# Patient Record
Sex: Female | Born: 1955 | Race: Black or African American | Hispanic: No | Marital: Married | Smoking: Former smoker
Health system: Southern US, Community
[De-identification: ages and names within clinical notes are randomized; demographics above are authoritative.]

---

## 1998-04-11 ENCOUNTER — Encounter: Admission: RE | Admit: 1998-04-11 | Discharge: 1998-04-11 | Payer: Self-pay | Admitting: Family Medicine

## 1998-04-28 ENCOUNTER — Ambulatory Visit (HOSPITAL_COMMUNITY): Admission: RE | Admit: 1998-04-28 | Discharge: 1998-04-28 | Payer: Self-pay

## 1998-12-02 ENCOUNTER — Encounter: Admission: RE | Admit: 1998-12-02 | Discharge: 1998-12-02 | Payer: Self-pay | Admitting: Family Medicine

## 2005-03-14 ENCOUNTER — Emergency Department (HOSPITAL_COMMUNITY): Admission: EM | Admit: 2005-03-14 | Discharge: 2005-03-14 | Payer: Self-pay | Admitting: Emergency Medicine

## 2009-10-13 ENCOUNTER — Encounter: Admission: RE | Admit: 2009-10-13 | Discharge: 2009-10-13 | Payer: Self-pay | Admitting: Family Medicine

## 2011-03-06 IMAGING — US US SOFT TISSUE HEAD/NECK
1 series · 14 of 25 positions shown · non-contrast
Comparison: None

CLINICAL DATA: Enlarged thyroid gland.

THYROID ULTRASOUND
TECHNIQUE: Ultrasound examination of the thyroid gland and
adjacent soft tissues was performed.

[Series 1: us soft tissue head/neck · 0.06mm/px · 14 of 31 slices shown]
[im 1/31]
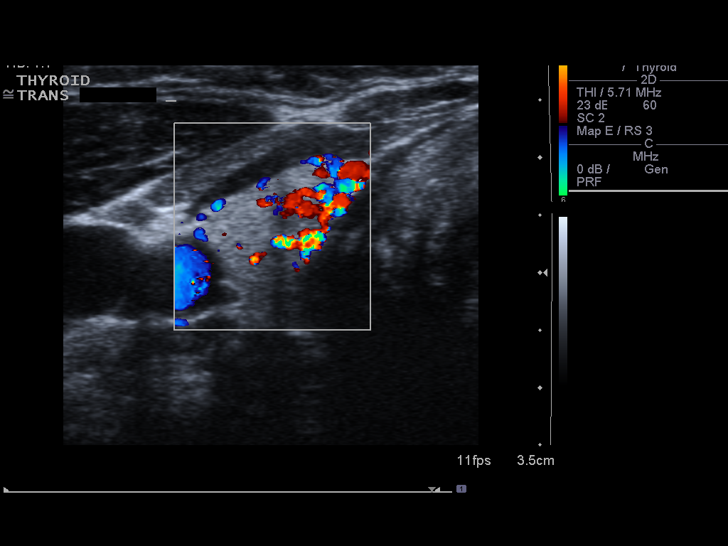
[im 3/31]
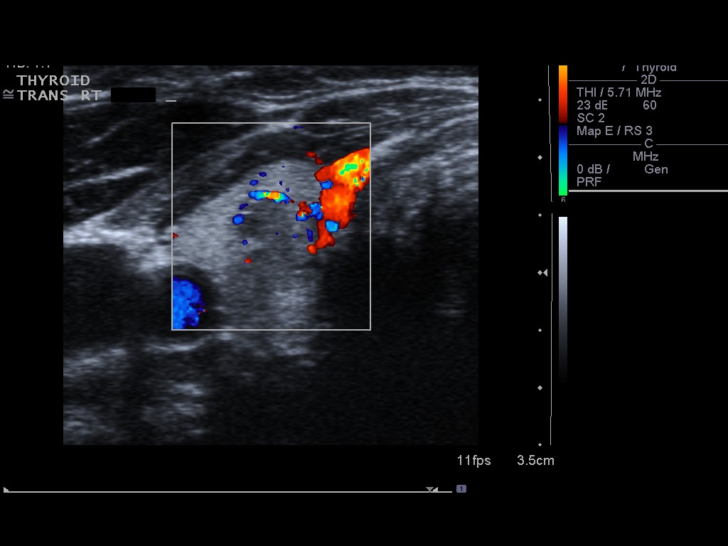
[im 6/31]
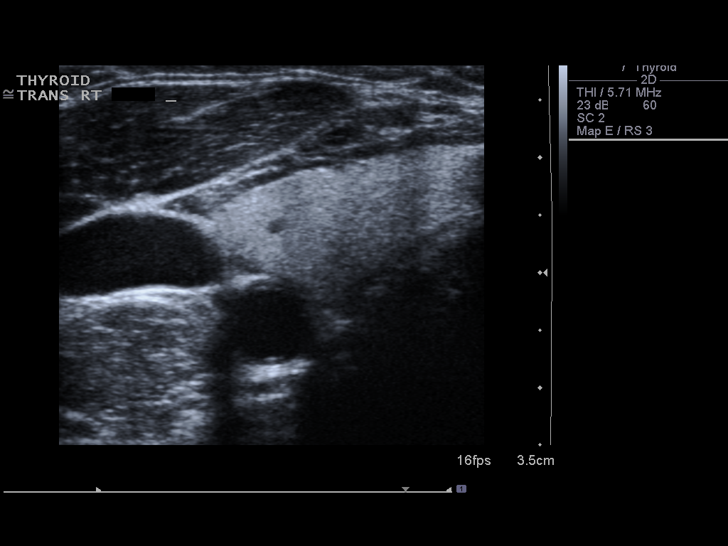
[im 8/31]
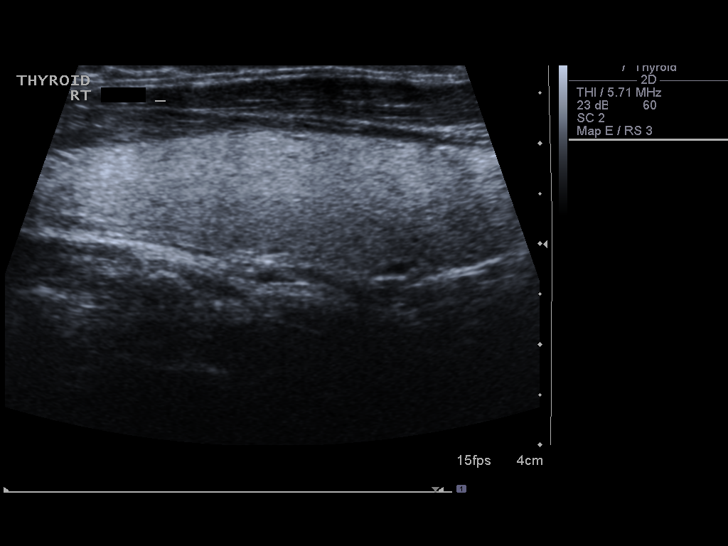
[im 11/31]
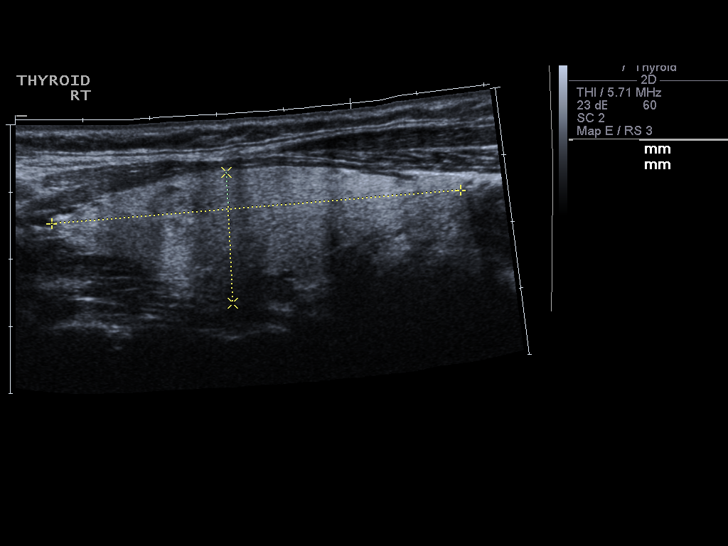
[im 12/31]
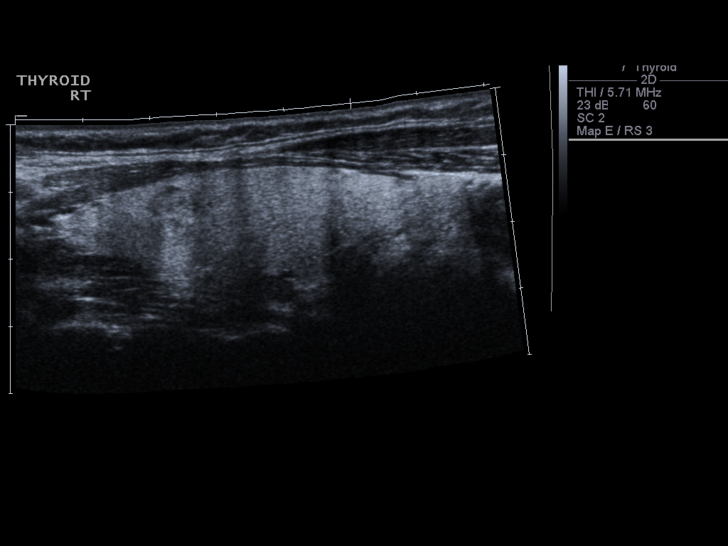
[im 14/31]
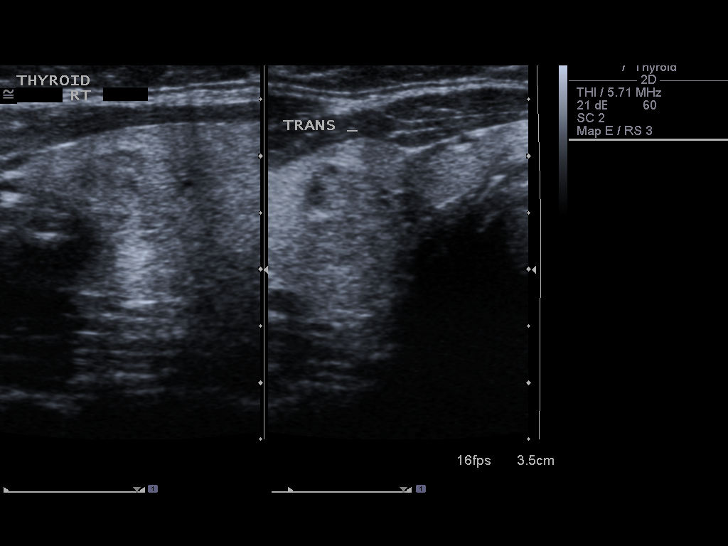
[im 17/31]
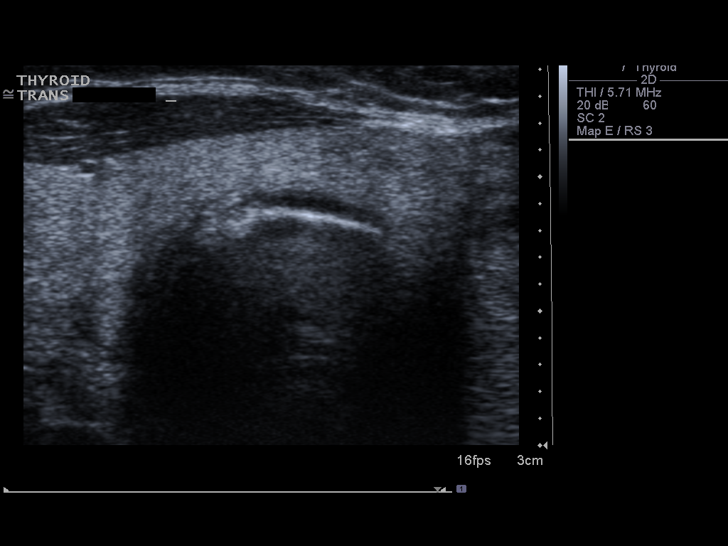
[im 19/31]
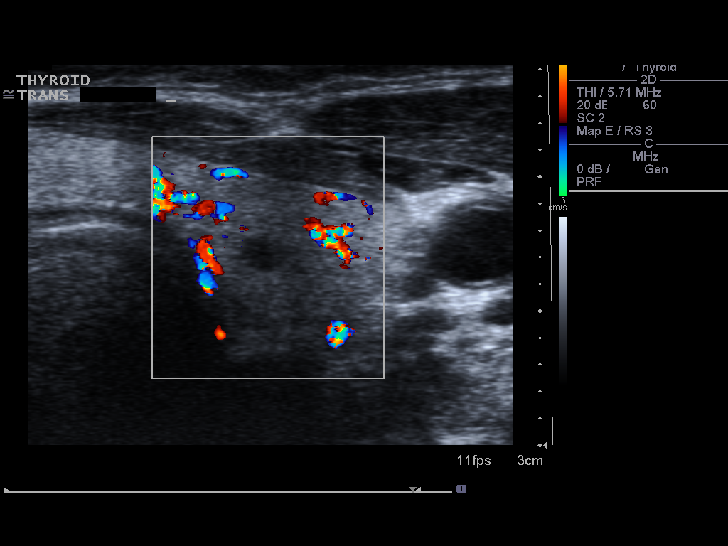
[im 21/31]
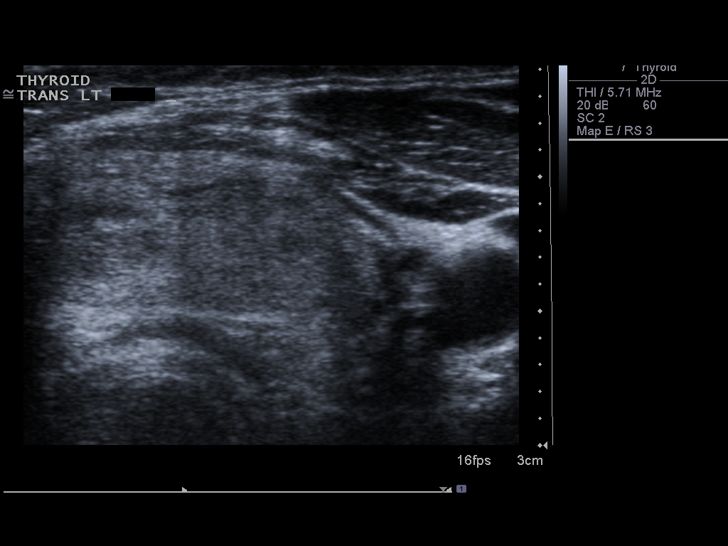
[im 23/31]
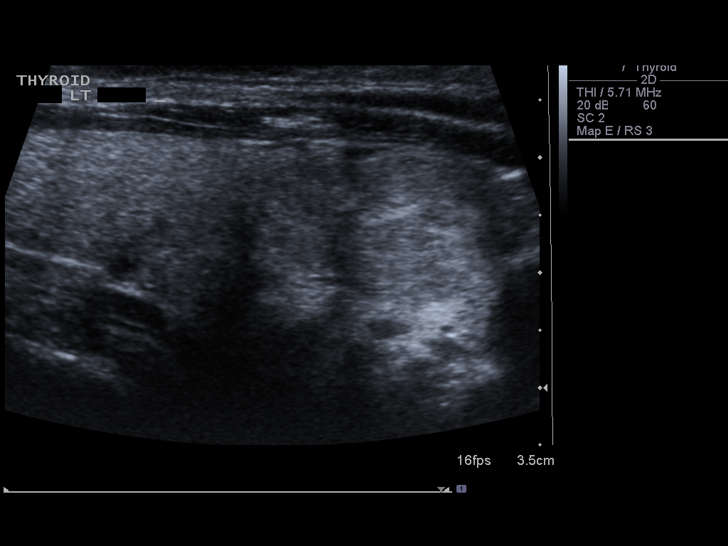
[im 26/31]
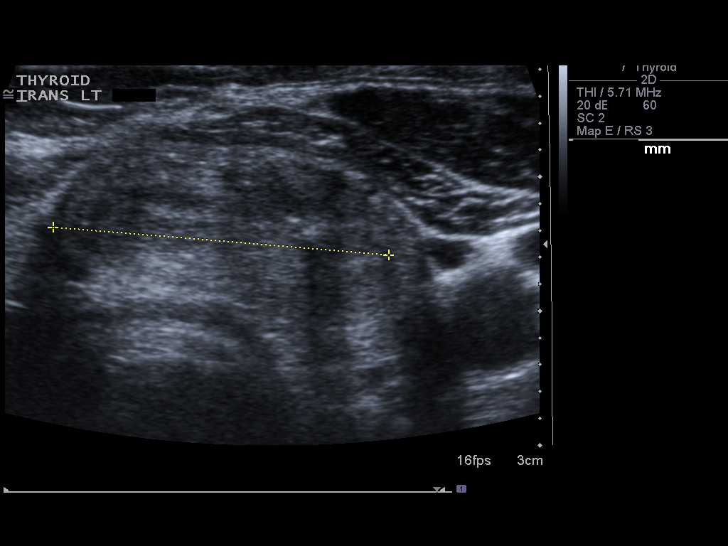
[im 28/31]
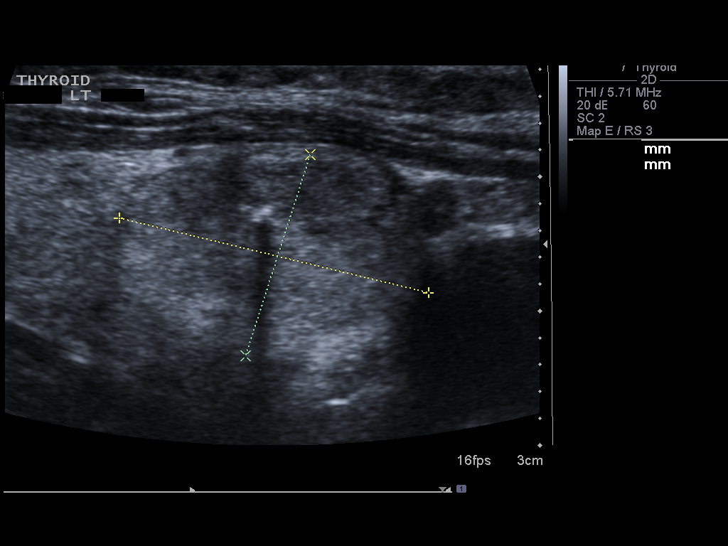
[im 31/31]
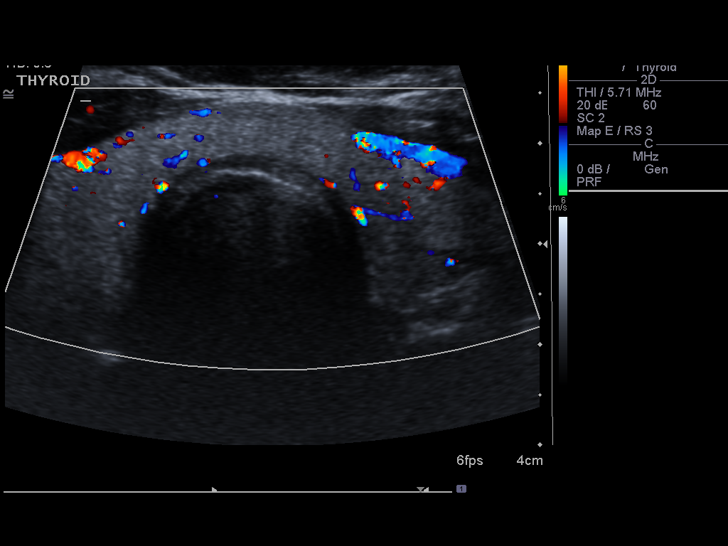

[14 of 25 positions shown; findings below may reference images not displayed]

FINDINGS: The right thyroid lobe measures 6.1 x 2.0 x 2.0 cm and
the left lobe measures 5.5 x 1.7 x 2.5 cm.  The isthmus measures
0.5 cm.  The echotexture is fairly homogeneous.

There are bilateral thyroid nodules. In the right lobe there is a
solid 1.3 x 0.8 x 1.2 cm nodule and slightly more inferiorly there
is a 0.9 x 0.3 x 0.6 cm nodule.  In the left lobe there is a
dominant 2.4 x 1.6 x 2.5 cm solid nodule and in the upper lobe.
There is a slightly complex cyst measuring 0.4 x 0.3 x 0.3 cm.
IMPRESSION: 1.  Mild diffuse thyroid gland enlargement.
2.  Dominant solid nodule in the left lower thyroid gland measuring
2.4 x 1.6 x 2.5 cm.  This requires fine-needle aspiration/biopsy.
The other smaller lesions can be followed with ultrasound depending
on the pathology of this lesion.

## 2012-01-16 ENCOUNTER — Ambulatory Visit (INDEPENDENT_AMBULATORY_CARE_PROVIDER_SITE_OTHER): Payer: PRIVATE HEALTH INSURANCE | Admitting: Emergency Medicine

## 2012-01-16 VITALS — BP 135/85 | HR 74 | Temp 99.1°F | Resp 17 | Ht 64.0 in | Wt 191.0 lb

## 2012-01-16 DIAGNOSIS — K047 Periapical abscess without sinus: Secondary | ICD-10-CM

## 2012-01-16 MED ORDER — CEPHALEXIN 500 MG PO CAPS: 500.0000 mg | ORAL_CAPSULE | Freq: Four times a day (QID) | ORAL | Status: AC

## 2012-01-16 MED ORDER — HYDROCODONE-ACETAMINOPHEN 5-500 MG PO TABS: 1.0000 | ORAL_TABLET | Freq: Four times a day (QID) | ORAL | Status: AC | PRN

## 2012-01-16 MED ORDER — CLINDAMYCIN HCL 300 MG PO CAPS: 300.0000 mg | ORAL_CAPSULE | Freq: Three times a day (TID) | ORAL | Status: AC

## 2012-01-16 NOTE — Progress Notes (Signed)
    Patient Name: Julie Braun Date of Birth: Feb 19, 1956 Medical Record Number: [id number] Gender: female Date of Encounter: 01/16/2012  Chief Complaint: Mouth Lesions   History of Present Illness:  Julie Braun is a 56 y.o. very pleasant female patient who presents with the following:  Left maxillary molar periapical abscess.  Has drained spontaneously.  No fever or chills  There is no problem list on file for this patient.  No past medical history on file. No past surgical history on file. History  Substance Use Topics  . Smoking status: Former Smoker -- 0.5 packs/day for 40 years    Types: Cigarettes  . Smokeless tobacco: Not on file  . Alcohol Use: Not on file   No family history on file. No Known Allergies  Medication list has been reviewed and updated.  No current outpatient prescriptions on file prior to visit.    Review of Systems:  As per HPI, otherwise negative.    Physical Examination: Filed Vitals:   01/16/12 1628  BP: 135/85  Pulse: 74  Temp: 99.1 F (37.3 C)  Resp: 17   Filed Vitals:   01/16/12 1628  Height: 5\' 4"  (1.626 m)  Weight: 191 lb (86.637 kg)   Body mass index is 32.79 kg/(m^2). Ideal Body Weight: Weight in (lb) to have BMI = 25: 145.3    GEN: WDWN, NAD, Non-toxic, Alert & Oriented x 3 HEENT: Atraumatic, Normocephalic.  Ears and Nose: No external deformity. EXTR: No clubbing/cyanosis/edema NEURO: Normal gait.  PSYCH: Normally interactive. Conversant. Not depressed or anxious appearing.  Calm demeanor.  Large periapical abscess on left mandibular molars.  EKG / Labs / Xrays: None available at time of encounter  Assessment and Plan: Periapical abscess Antibiotics vicodin Dentist as soon as possible  Dareen Piano, Tessa Lerner, MD

## 2012-03-09 ENCOUNTER — Ambulatory Visit (INDEPENDENT_AMBULATORY_CARE_PROVIDER_SITE_OTHER): Payer: PRIVATE HEALTH INSURANCE | Admitting: Family Medicine

## 2012-03-09 VITALS — BP 114/72 | HR 72 | Temp 98.3°F | Resp 16 | Ht 63.5 in | Wt 189.0 lb

## 2012-03-09 DIAGNOSIS — Z111 Encounter for screening for respiratory tuberculosis: Secondary | ICD-10-CM

## 2012-03-09 MED ORDER — TUBERCULIN PPD 5 UNIT/0.1ML ID SOLN: 5.0000 [IU] | Freq: Once | INTRADERMAL | Status: AC

## 2012-03-09 NOTE — Progress Notes (Signed)
  [facility]:  Office Visit  Chief Complaint:  Chief Complaint  Patient presents with  . PPD Reading    placement    HPI: Julie Braun is a 56 y.o. female who complains of  Here for TB screening for internship at rehab/recovery facility. No prior history of TB or exposure to TB.   No past medical history on file. No past surgical history on file. History   Social History  . Marital Status: Married    Spouse Name: N/A    Number of Children: N/A  . Years of Education: N/A   Social History Main Topics  . Smoking status: Former Smoker -- 0.5 packs/day for 40 years    Types: Cigarettes  . Smokeless tobacco: None  . Alcohol Use: None  . Drug Use: None  . Sexually Active: None   Other Topics Concern  . None   Social History Narrative  . None   No family history on file. No Known Allergies Prior to Admission medications   Not on File     ROS: The patient denies fevers, chills, night sweats, unintentional weight loss, chest pain, palpitations, wheezing, dyspnea on exertion, nausea, vomiting, abdominal pain, dysuria, hematuria, melena, numbness, weakness, or tingling.  All other systems have been reviewed and were otherwise negative with the exception of those mentioned in the HPI and as above.    PHYSICAL EXAM: Filed Vitals:   03/09/12 1422  BP: 114/72  Pulse: 72  Temp: 98.3 F (36.8 C)  Resp: 16   Filed Vitals:   03/09/12 1422  Height: 5' 3.5" (1.613 m)  Weight: 189 lb (85.73 kg)   Body mass index is 32.95 kg/(m^2).  General: Alert, no acute distress HEENT:  Normocephalic, atraumatic, oropharynx patent.  Cardiovascular:  Regular rate and rhythm, no rubs murmurs or gallops.  No Carotid bruits, radial pulse intact. No pedal edema.  Respiratory: Clear to auscultation bilaterally.  No wheezes, rales, or rhonchi.  No cyanosis, no use of accessory musculature GI: No organomegaly, abdomen is soft and non-tender, positive bowel sounds.  No  masses. Skin: No rashes. Neurologic: Facial musculature symmetric. Psychiatric: Patient is appropriate throughout our interaction. Lymphatic: No cervical lymphadenopathy Musculoskeletal: Gait intact.   LABS: No results found for this or any previous visit.   EKG/XRAY:   Primary read interpreted by Dr. Conley Rolls at [facility].   ASSESSMENT/PLAN: Encounter Diagnosis  Name Primary?  . Tuberculosis screening Yes   F/u as directed    ,  PHUONG, DO 03/09/2012 2:37 PM

## 2012-03-12 ENCOUNTER — Ambulatory Visit (INDEPENDENT_AMBULATORY_CARE_PROVIDER_SITE_OTHER): Payer: PRIVATE HEALTH INSURANCE | Admitting: Radiology

## 2012-03-12 DIAGNOSIS — IMO0001 Reserved for inherently not codable concepts without codable children: Secondary | ICD-10-CM

## 2012-03-12 DIAGNOSIS — Z111 Encounter for screening for respiratory tuberculosis: Secondary | ICD-10-CM

## 2012-03-12 NOTE — Progress Notes (Signed)
  Subjective:    Patient ID: Julie Braun, female    DOB: 06-29-1956, 56 y.o.   MRN: [id number]  HPI  Patient returns today for reading of her TB Skin test  Review of Systems     Objective:   Physical Exam 0 mm induration, right arm. negative no further work up indicated.        Assessment & Plan:  No further work up necessary patient advised negative results

## 2012-03-20 ENCOUNTER — Encounter: Payer: Self-pay | Admitting: Family Medicine

## 2016-01-14 DEATH — deceased

## 2018-01-02 ENCOUNTER — Ambulatory Visit: Payer: BC Managed Care – PPO | Admitting: Family Medicine

## 2018-01-02 NOTE — Progress Notes (Deleted)
  No chief complaint on file.   HPI  4 review of systems  No past medical history on file.  No current outpatient medications on file.   Current Facility-Administered Medications  Medication Dose Route Frequency Provider Last Rate Last Dose  . tuberculin injection 5 Units  5 Units Intradermal Once Le, Thao P, DO        Allergies: No Known Allergies  *** The histories are not reviewed yet. Please review them in the "History" navigator section and refresh this SmartLink.  Social History   Socioeconomic History  . Marital status: Married    Spouse name: Not on file  . Number of children: Not on file  . Years of education: Not on file  . Highest education level: Not on file  Occupational History  . Not on file  Social Needs  . Financial resource strain: Not on file  . Food insecurity:    Worry: Not on file    Inability: Not on file  . Transportation needs:    Medical: Not on file    Non-medical: Not on file  Tobacco Use  . Smoking status: Former Smoker    Packs/day: 0.50    Years: 40.00    Pack years: 20.00    Types: Cigarettes  Substance and Sexual Activity  . Alcohol use: Not on file  . Drug use: Not on file  . Sexual activity: Not on file  Lifestyle  . Physical activity:    Days per week: Not on file    Minutes per session: Not on file  . Stress: Not on file  Relationships  . Social connections:    Talks on phone: Not on file    Gets together: Not on file    Attends religious service: Not on file    Active member of club or organization: Not on file    Attends meetings of clubs or organizations: Not on file    Relationship status: Not on file  Other Topics Concern  . Not on file  Social History Narrative  . Not on file    No family history on file.   ROS Review of Systems See HPI Constitution: No fevers or chills No malaise No diaphoresis Skin: No rash or itching Eyes: no blurry vision, no double vision GU: no dysuria or hematuria Neuro:  no dizziness or headaches * all others reviewed and negative   Objective: There were no vitals filed for this visit.  Physical Exam  Assessment and Plan There are no diagnoses linked to this encounter.   Mavrick Mcquigg P [employer]
